# Patient Record
Sex: Male | Born: 1990 | Race: White | Hispanic: No | Marital: Single | Smoking: Never smoker
Health system: Southern US, Community
[De-identification: ages and names within clinical notes are randomized; demographics above are authoritative.]

## PROBLEM LIST (undated history)

## (undated) HISTORY — PX: TONSILLECTOMY: SUR1361

---
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
## 2013-03-20 ENCOUNTER — Emergency Department: Payer: Self-pay | Admitting: Emergency Medicine

## 2016-08-04 ENCOUNTER — Encounter: Payer: Self-pay | Admitting: Emergency Medicine

## 2016-08-04 ENCOUNTER — Ambulatory Visit
Admission: EM | Admit: 2016-08-04 | Discharge: 2016-08-04 | Disposition: A | Discharge status: Home / Self Care | Payer: BLUE CROSS/BLUE SHIELD | Attending: Family Medicine | Admitting: Family Medicine

## 2016-08-04 DIAGNOSIS — T1592XA Foreign body on external eye, part unspecified, left eye, initial encounter: Secondary | ICD-10-CM | POA: Diagnosis not present

## 2016-08-04 NOTE — ED Triage Notes (Signed)
Patient states that his left eye is red and irritated possibly by some metal.  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

## 2016-08-04 NOTE — ED Provider Notes (Signed)
CSN: [id number]     Arrival date & time 08/04/16  1339 History   First MD Initiated Contact with Patient 08/04/16 1433     Chief Complaint  Patient presents with  . Foreign Body in Eye   (Consider location/radiation/quality/duration/timing/severity/associated sxs/prior Treatment) Patient is a healthy 25 year old male, with no medical history, presents today for possible foreign body in his left eye. Patient started having symptoms yesterday complaining of eye pain with redness,  irritation and excessive tears. Patient denies itchiness. 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       History reviewed. No pertinent past medical history. History reviewed. No pertinent surgical history. History reviewed. No pertinent family history. Social History  Substance Use Topics  . Smoking status: Never Smoker  . Smokeless tobacco: Never Used  . Alcohol use Yes    Review of Systems  All other systems reviewed and are negative.   Allergies  Patient has no known allergies.  Home Medications   Prior to Admission medications   Not on File   Meds Ordered and Administered this Visit  Medications - No data to display  BP 132/72 (BP Location: Right Arm)   Pulse 88   Temp 98.7 F (37.1 C) (Oral)   Resp 16   Ht 5\' 11"  (1.803 m)   Wt 250 lb (113.4 kg)   SpO2 99%   BMI 34.87 kg/m  No data found.   Physical Exam  Constitutional: He appears well-developed and well-nourished.  HENT:  Head: Normocephalic and atraumatic.  Right eye unremarkable.  Left eye appears red. Has what appears to be a small round piece of metal in the left upper iris between 12-1 o'clock  Eyes: EOM are normal. Pupils are equal, round, and reactive to light. Right eye exhibits no discharge. Left eye exhibits no discharge. Foreign body present in the left eye. No scleral icterus.    Cardiovascular: Normal rate, regular rhythm and normal heart sounds.   Pulmonary/Chest: Effort  normal and breath sounds normal. No respiratory distress. He has no wheezes.  Nursing note and vitals reviewed.   Urgent Care Course   Clinical Course     Procedures (including critical care time)  Labs Review Labs Reviewed - No data to display  Imaging Review No results found.   Visual Acuity Review  Right Eye Distance: 20/20 uncorrected Left Eye Distance: 20/25 uncorrected Bilateral Distance: 20/20 uncorrected  MDM   1. Foreign body in eye, left, initial encounter    On examination, patient does have what appears to be a small round piece of metal in the left eye. Fluorescein eye exam shows no corneal abrasion. Supervising physician Dr. Thurmond ButtsWade attempted to remove with 18G needle and cotton-tip swab, but was unsuccessful.   [facility] Dr. Druscilla BrowniePorfilio consulted and will [facility] in 20 minutes. Patient discharged immediately and will meet Dr. Druscilla BrowniePorfilio at the [place] location.     Lucia EstelleFeng Loukas Antonson, NP 08/04/16 [phone]

## 2017-01-09 ENCOUNTER — Emergency Department
Admission: EM | Admit: 2017-01-09 | Discharge: 2017-01-09 | Disposition: A | Discharge status: Home / Self Care | Payer: BLUE CROSS/BLUE SHIELD | Attending: Emergency Medicine | Admitting: Emergency Medicine

## 2017-01-09 ENCOUNTER — Emergency Department: Payer: BLUE CROSS/BLUE SHIELD

## 2017-01-09 ENCOUNTER — Encounter: Payer: Self-pay | Admitting: Medical Oncology

## 2017-01-09 DIAGNOSIS — Y9389 Activity, other specified: Secondary | ICD-10-CM | POA: Diagnosis not present

## 2017-01-09 DIAGNOSIS — Y92410 Unspecified street and highway as the place of occurrence of the external cause: Secondary | ICD-10-CM | POA: Insufficient documentation

## 2017-01-09 DIAGNOSIS — S8011XA Contusion of right lower leg, initial encounter: Secondary | ICD-10-CM | POA: Diagnosis not present

## 2017-01-09 DIAGNOSIS — S5011XA Contusion of right forearm, initial encounter: Secondary | ICD-10-CM | POA: Diagnosis not present

## 2017-01-09 DIAGNOSIS — S59911A Unspecified injury of right forearm, initial encounter: Secondary | ICD-10-CM | POA: Diagnosis present

## 2017-01-09 DIAGNOSIS — Y998 Other external cause status: Secondary | ICD-10-CM | POA: Insufficient documentation

## 2017-01-09 MED ORDER — HYDROCODONE-ACETAMINOPHEN 5-325 MG PO TABS
1.0000 | ORAL_TABLET | Freq: Once | ORAL | Status: AC
  Administered 2017-01-09: 1 via ORAL
  Filled 2017-01-09: qty 1

## 2017-01-09 MED ORDER — IBUPROFEN 600 MG PO TABS: 600.0000 mg | ORAL_TABLET | Freq: Three times a day (TID) | ORAL | 0 refills | Status: DC | PRN

## 2017-01-09 MED ORDER — HYDROCODONE-ACETAMINOPHEN 5-325 MG PO TABS: 1.0000 | ORAL_TABLET | ORAL | 0 refills | Status: DC | PRN

## 2017-01-09 MED ORDER — IBUPROFEN 600 MG PO TABS
600.0000 mg | ORAL_TABLET | Freq: Once | ORAL | Status: AC
  Administered 2017-01-09: 600 mg via ORAL
  Filled 2017-01-09: qty 1

## 2017-01-09 NOTE — ED Notes (Signed)
Right forearm pain. No bruising, swelling. Pt unable to squeeze hand due to pain. +2 radial pulse. Right lateral calf pain. No bruising or swelling. Pain increases with flex.

## 2017-01-09 NOTE — ED Notes (Signed)
I called radiology about missing forearm xray read. They are looking into it.
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
## 2017-01-09 NOTE — Discharge Instructions (Signed)
Began taking medication as directed. Ibuprofen 600 mg 3 times a day with food. Norco one every 4-6 hours as needed for pain. Be aware that you cannot take Norco while driving as it contains narcotic. Moist heat or ice to your muscles as needed.  To [facility] if any continued problems.

## 2017-01-09 NOTE — ED Notes (Signed)
Pt discharged home after verbalizing understanding of discharge instructions; nad noted. 
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
## 2018-09-24 ENCOUNTER — Other Ambulatory Visit: Payer: Self-pay

## 2018-09-24 ENCOUNTER — Encounter: Payer: Self-pay | Admitting: Emergency Medicine

## 2018-09-24 ENCOUNTER — Ambulatory Visit
Admission: EM | Admit: 2018-09-24 | Discharge: 2018-09-24 | Disposition: A | Discharge status: Home / Self Care | Payer: BLUE CROSS/BLUE SHIELD | Attending: Family Medicine | Admitting: Family Medicine

## 2018-09-24 DIAGNOSIS — J111 Influenza due to unidentified influenza virus with other respiratory manifestations: Secondary | ICD-10-CM

## 2018-09-24 DIAGNOSIS — R509 Fever, unspecified: Secondary | ICD-10-CM

## 2018-09-24 DIAGNOSIS — R69 Illness, unspecified: Principal | ICD-10-CM

## 2018-09-24 DIAGNOSIS — R05 Cough: Secondary | ICD-10-CM

## 2018-09-24 LAB — RAPID INFLUENZA A&B ANTIGENS
Influenza A (ARMC): NEGATIVE
Influenza B (ARMC): NEGATIVE

## 2018-09-24 MED ORDER — OSELTAMIVIR PHOSPHATE 75 MG PO CAPS: 75.0000 mg | ORAL_CAPSULE | Freq: Two times a day (BID) | ORAL | 0 refills | Status: DC

## 2018-09-24 MED ORDER — IBUPROFEN 800 MG PO TABS: 800.0000 mg | ORAL_TABLET | Freq: Three times a day (TID) | ORAL | 0 refills | Status: DC | PRN

## 2018-09-24 MED ORDER — ACETAMINOPHEN 500 MG PO TABS: 500.0000 mg | ORAL_TABLET | Freq: Once | ORAL | Status: DC

## 2018-09-24 MED ORDER — ACETAMINOPHEN 500 MG PO TABS
1000.0000 mg | ORAL_TABLET | Freq: Once | ORAL | Status: AC
  Administered 2018-09-24: 1000 mg via ORAL

## 2018-09-24 NOTE — ED Provider Notes (Signed)
[facility]    CSN: [id number] Arrival date & time: 09/24/18  1640  History   Chief Complaint Chief Complaint  Patient presents with  . Cough  . Fever  . Generalized Body Aches   HPI  28 year old male presents with the above complaints.  Patient reports that his symptoms started on Monday.  He reports cough, fever, generalized body aches.  Has had a mild cough.  Fever has persisted.  Symptoms are severe.  He has been taking ibuprofen without resolution.  No known exacerbating factors.  No other associated symptoms.  No other complaints.  History reviewed and updated as below.  No significant PMH.  Home Medications    Prior to Admission medications   Medication Sig Start Date End Date Taking? Authorizing Provider  ibuprofen (ADVIL,MOTRIN) 800 MG tablet Take 1 tablet (800 mg total) by mouth every 8 (eight) hours as needed for fever, headache or moderate pain. 09/24/18   Tommie Sams, DO  oseltamivir (TAMIFLU) 75 MG capsule Take 1 capsule (75 mg total) by mouth every 12 (twelve) hours. 09/24/18   Tommie Sams, DO   Social History Social History   Tobacco Use  . Smoking status: Never Smoker  . Smokeless tobacco: Never Used  Substance Use Topics  . Alcohol use: Yes  . Drug use: Never    Allergies   Patient has no known allergies.   Review of Systems Review of Systems  Constitutional: Positive for chills and fever.  Respiratory: Positive for cough.   Musculoskeletal:       Body aches.   Physical Exam Triage Vital Signs ED Triage Vitals  Enc Vitals Group     BP 09/24/18 1656 126/90     Pulse Rate 09/24/18 1656 (!) 102     Resp 09/24/18 1656 18     Temp 09/24/18 1656 (!) 103 F (39.4 C)     Temp Source 09/24/18 1656 Oral     SpO2 09/24/18 1656 99 %     Weight 09/24/18 1654 250 lb (113.4 kg)     Height 09/24/18 1654 5\' 11"  (1.803 m)     Head Circumference --      Peak Flow --      Pain Score 09/24/18 1654 6     Pain Loc --      Pain Edu? --      Excl. in GC? --    Updated Vital Signs BP 126/90 (BP Location: Right Arm)   Pulse (!) 102   Temp (!) 103 F (39.4 C) (Oral)   Resp 18   Ht 5\' 11"  (1.803 m)   Wt 113.4 kg   SpO2 99%   BMI 34.87 kg/m   Visual Acuity Right Eye Distance:   Left Eye Distance:   Bilateral Distance:    Right Eye Near:   Left Eye Near:    Bilateral Near:     Physical Exam Vitals signs and nursing note reviewed.  Constitutional:      General: He is not in acute distress.    Appearance: Normal appearance.  HENT:     Head: Normocephalic and atraumatic.     Nose: Nose normal.     Mouth/Throat:     Pharynx: Oropharynx is clear. No posterior oropharyngeal erythema.  Eyes:     General:        Right eye: No discharge.        Left eye: No discharge.     Conjunctiva/sclera: Conjunctivae normal.  Cardiovascular:  Rate and Rhythm: Normal rate and regular rhythm.  Pulmonary:     Effort: Pulmonary effort is normal.     Breath sounds: Normal breath sounds. No wheezing, rhonchi or rales.  Neurological:     Mental Status: He is alert.  Psychiatric:        Mood and Affect: Mood normal.        Behavior: Behavior normal.    UC Treatments / Results  Labs (all labs ordered are listed, but only abnormal results are displayed) Labs Reviewed  RAPID INFLUENZA A&B ANTIGENS ([facility] ONLY)    EKG None  Radiology No results found.  Procedures Procedures (including critical care time)  Medications Ordered in UC Medications  acetaminophen (TYLENOL) tablet 1,000 mg (1,000 mg Oral Given 09/24/18 1706)    Initial Impression / Assessment and Plan / UC Course  I have reviewed the triage vital signs and the nursing notes.  Pertinent labs & imaging results that were available during my care of the patient were reviewed by me and considered in my medical decision making (see chart for details).    28 year old male presents with suspected influenza.  Treating with Tamiflu.  Ibuprofen and Tylenol as  directed.  Final Clinical Impressions(s) / UC Diagnoses   Final diagnoses:  Influenza-like illness     Discharge Instructions     Tylenol 1000 mg and Ibuprofen 800 mg every 8 hours for pain/fever.  Tamiflu as prescribed.  Take care  Dr. Adriana Simas    ED Prescriptions    Medication Sig Dispense Auth. Provider   ibuprofen (ADVIL,MOTRIN) 800 MG tablet Take 1 tablet (800 mg total) by mouth every 8 (eight) hours as needed for fever, headache or moderate pain. 30 tablet Javid Kemler G, DO   oseltamivir (TAMIFLU) 75 MG capsule Take 1 capsule (75 mg total) by mouth every 12 (twelve) hours. 10 capsule Tommie Sams, DO     Controlled Substance Prescriptions [place] Controlled Substance Registry consulted? Not Applicable   Tommie Sams, DO 09/24/18 7628

## 2018-09-24 NOTE — Discharge Instructions (Addendum)
Tylenol 1000 mg and Ibuprofen 800 mg every 8 hours for pain/fever.  Tamiflu as prescribed.  Take care  Dr. Adriana Simas

## 2018-09-24 NOTE — ED Triage Notes (Signed)
Patient cough, fever, generalized body aches that started 3 days ago. Patient has been taken Ibuprofen 600mg  for his fever.

## 2019-06-04 ENCOUNTER — Encounter: Payer: Self-pay | Admitting: Emergency Medicine

## 2019-06-04 ENCOUNTER — Other Ambulatory Visit: Payer: Self-pay

## 2019-06-04 ENCOUNTER — Ambulatory Visit
Admission: EM | Admit: 2019-06-04 | Discharge: 2019-06-04 | Disposition: A | Discharge status: Home / Self Care | Payer: Self-pay | Attending: Emergency Medicine | Admitting: Emergency Medicine

## 2019-06-04 DIAGNOSIS — L03211 Cellulitis of face: Secondary | ICD-10-CM

## 2019-06-04 DIAGNOSIS — R21 Rash and other nonspecific skin eruption: Secondary | ICD-10-CM

## 2019-06-04 MED ORDER — VALACYCLOVIR HCL 1 G PO TABS: 1000.0000 mg | ORAL_TABLET | Freq: Three times a day (TID) | ORAL | 0 refills | Status: AC

## 2019-06-04 MED ORDER — SULFAMETHOXAZOLE-TRIMETHOPRIM 800-160 MG PO TABS: 1.0000 | ORAL_TABLET | Freq: Two times a day (BID) | ORAL | 0 refills | Status: AC

## 2019-06-04 NOTE — Discharge Instructions (Signed)
Take medication as prescribed. Monitor as discussed.    Follow up with your primary care physician this week as needed. Return to Urgent care or ER for eye pain, ear changes, new or worsening concerns.

## 2019-06-04 NOTE — ED Provider Notes (Signed)
[facility] ____________________________________________  Time seen: Approximately 7:08 PM  I have reviewed the triage vital signs and the nursing notes.   HISTORY  Chief Complaint Facial Swelling   HPI Ethan Woods is a 28 y.o. male presenting for evaluation to rash to his forehead.  Reports rash has been present for the last week.  States initially look like 2 small pimples in which she tried to "pop "but did not get anything out of.  States he then noticed it increase in size and redness.  Reports some swelling just in front of his ear as well as underneath his jaw that is slightly tender.  Denies any rash to his ear or hearing changes or actual ear pain.  Denies any eye pain, vision changes or eye discomfort.  Denies insect bite, injury or trauma.  Denies others with similar.  Works on a Architect site.  Denies aggravating or alleviating factors.  Did have chickenpox as a child.  Reports otherwise doing well.   History reviewed. No pertinent past medical history.  There are no active problems to display for this patient.   Past Surgical History:  Procedure Laterality Date  . TONSILLECTOMY       No current facility-administered medications for this encounter.   Current Outpatient Medications:  .  sulfamethoxazole-trimethoprim (BACTRIM DS) 800-160 MG tablet, Take 1 tablet by mouth 2 (two) times daily for 10 days., Disp: 20 tablet, Rfl: 0 .  valACYclovir (VALTREX) 1000 MG tablet, Take 1 tablet (1,000 mg total) by mouth 3 (three) times daily for 7 days., Disp: 21 tablet, Rfl: 0  Allergies Patient has no known allergies.  History reviewed. No pertinent family history.  Social History Social History   Tobacco Use  . Smoking status: Never Smoker  . Smokeless tobacco: Never Used  Substance Use Topics  . Alcohol use: Yes  . Drug use: Never    Review of Systems Constitutional: No fever ENT: No sore throat. Cardiovascular: Denies chest pain.  Respiratory: Denies shortness of breath. Gastrointestinal: No abdominal pain.   Musculoskeletal: Negative for back pain. Skin: Positive for rash.   ____________________________________________   PHYSICAL EXAM:  VITAL SIGNS: ED Triage Vitals  Enc Vitals Group     BP 06/04/19 1817 137/88     Pulse Rate 06/04/19 1817 87     Resp 06/04/19 1817 18     Temp 06/04/19 1817 98.5 F (36.9 C)     Temp Source 06/04/19 1817 Oral     SpO2 06/04/19 1817 99 %     Weight 06/04/19 1815 250 lb (113.4 kg)     Height 06/04/19 1815 5\' 11"  (1.803 m)     Head Circumference --      Peak Flow --      Pain Score 06/04/19 1814 6     Pain Loc --      Pain Edu? --      Excl. in [place]? --     Constitutional: Alert and oriented. Well appearing and in no acute distress. Eyes: Conjunctivae are normal. PERRL. EOMI.No surrounding tenderness, swelling or erythema.  ENT      Head: Normocephalic      Ears: Left: Nontender, no rash, normal canal, no erythema, normal TM.      Nose: No congestion Hematological/Lymphatic/Immunilogical: Left mild preauricular lymphadenopathy and mild anterior cervical lymphadenopathy left-sided. Cardiovascular: Normal rate, regular rhythm. Grossly normal heart sounds. Good peripheral circulation. Respiratory: Normal respiratory effort without tachypnea nor retractions. Breath sounds are clear and equal bilaterally.  No wheezes, rales, rhonchi. Musculoskeletal: Steady gait. Neurologic:  Normal speech and language. Speech is normal. No gait instability.  Skin:  Skin is warm, dry.  Except: Left face rash as depicted below, two mildly erythematous clustered non vesicular rash, nontender, no drainage.    Psychiatric: Mood and affect are normal. Speech and behavior are normal. Patient exhibits appropriate insight and judgment   ___________________________________________   LABS (all labs ordered are listed, but only abnormal results are displayed)  Labs Reviewed - No data to  display   PROCEDURES Procedures     INITIAL IMPRESSION / ASSESSMENT AND PLAN / ED COURSE  Pertinent labs & imaging results that were available during my care of the patient were reviewed by me and considered in my medical decision making (see chart for details).  Well appearing patient. Left facial rash. Concern for cellulitis vs shingles. Will treat with bactrim and valtrex. Keep clean, supportive care and monitor. Strict reevaluation for eye pain, ear pain, ear rash or worsening concerns. Discussed indication, risks and benefits of medications with patient.  Discussed follow up with Primary care physician this week. Discussed follow up and return parameters including no resolution or any worsening concerns. Patient verbalized understanding and agreed to plan.   ____________________________________________   FINAL CLINICAL IMPRESSION(S) / ED DIAGNOSES  Final diagnoses:  Facial cellulitis     ED Discharge Orders         Ordered    sulfamethoxazole-trimethoprim (BACTRIM DS) 800-160 MG tablet  2 times daily     06/04/19 1835    valACYclovir (VALTREX) 1000 MG tablet  3 times daily     06/04/19 1835           Note: This dictation was prepared with Dragon dictation along with smaller phrase technology. Any transcriptional errors that result from this process are unintentional.         Renford Dills, NP 06/04/19 1923

## 2019-06-04 NOTE — ED Triage Notes (Signed)
Pt c/o swelling on the left side of his face in front of his ear. He states that he feels a "knot" in the area. His left hurts and he has a rash on his forehead. Started about 4 days ago.

## 2020-05-10 ENCOUNTER — Other Ambulatory Visit: Payer: Self-pay

## 2020-05-20 ENCOUNTER — Encounter: Payer: Self-pay | Admitting: Emergency Medicine

## 2020-05-20 ENCOUNTER — Other Ambulatory Visit: Payer: Self-pay

## 2020-05-20 ENCOUNTER — Ambulatory Visit
Admission: EM | Admit: 2020-05-20 | Discharge: 2020-05-20 | Disposition: A | Discharge status: Home / Self Care | Payer: Self-pay | Attending: Physician Assistant | Admitting: Physician Assistant

## 2020-05-20 DIAGNOSIS — R059 Cough, unspecified: Secondary | ICD-10-CM | POA: Insufficient documentation

## 2020-05-20 DIAGNOSIS — J209 Acute bronchitis, unspecified: Secondary | ICD-10-CM | POA: Insufficient documentation

## 2020-05-20 DIAGNOSIS — J029 Acute pharyngitis, unspecified: Secondary | ICD-10-CM | POA: Insufficient documentation

## 2020-05-20 LAB — GROUP A STREP BY PCR: Group A Strep by PCR: NOT DETECTED

## 2020-05-20 MED ORDER — GUAIFENESIN-CODEINE 100-10 MG/5ML PO SYRP: 5.0000 mL | ORAL_SOLUTION | Freq: Four times a day (QID) | ORAL | 0 refills | Status: DC | PRN

## 2020-05-20 MED ORDER — LIDOCAINE VISCOUS HCL 2 % MT SOLN: 15.0000 mL | OROMUCOSAL | 0 refills | Status: DC | PRN

## 2020-05-20 MED ORDER — AZITHROMYCIN 250 MG PO TABS: 250.0000 mg | ORAL_TABLET | Freq: Every day | ORAL | 0 refills | Status: DC

## 2020-05-20 MED ORDER — PREDNISONE 20 MG PO TABS: 40.0000 mg | ORAL_TABLET | Freq: Every day | ORAL | 0 refills | Status: AC

## 2020-05-20 MED ORDER — PREDNISONE 20 MG PO TABS: 40.0000 mg | ORAL_TABLET | Freq: Every day | ORAL | 0 refills | Status: DC

## 2020-05-20 MED ORDER — LIDOCAINE VISCOUS HCL 2 % MT SOLN: 15.0000 mL | OROMUCOSAL | 0 refills | Status: AC | PRN

## 2020-05-20 MED ORDER — GUAIFENESIN-CODEINE 100-10 MG/5ML PO SYRP: 5.0000 mL | ORAL_SOLUTION | Freq: Four times a day (QID) | ORAL | 0 refills | Status: AC | PRN

## 2020-05-20 NOTE — ED Triage Notes (Signed)
Patient c/o sore throat and cough that started 2 weeks ago.  Patient denies fevers.

## 2020-05-20 NOTE — ED Provider Notes (Signed)
[facility]    CSN: [id number] Arrival date & time: 05/20/20  0263      History   Chief Complaint Chief Complaint  Patient presents with  . Sore Throat  . Cough    HPI Ethan Woods is a 29 y.o. male presenting for 2 week history of sore throat and cough.  Patient says he is most concerned about his throat.  He says he has been coughing so much that his throat hurts.  Patient denies any associated fever, fatigue, body aches, sinus pain, ear pain, nasal congestion, chest pain or breathing difficulty.  He states that he feels like he is wheezing at times due to the "stuff in his chest."  He says that he is coughing up green mucus.  Has been taking over-the-counter Mucinex without relief.  States that he feels aches getting worse.  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  Denies any known Covid exposure.  Patient not Covid vaccinated.  He is otherwise healthy without any medical problems.  Patient has no other complaints or concerns today.  HPI  History reviewed. No pertinent past medical history.  There are no problems to display for this patient.   Past Surgical History:  Procedure Laterality Date  . TONSILLECTOMY         Home Medications    Prior to Admission medications   Medication Sig Start Date End Date Taking? Authorizing Provider  azithromycin (ZITHROMAX) 250 MG tablet Take 1 tablet (250 mg total) by mouth daily. Take first 2 tablets together, then 1 every day until finished. 05/20/20   Shirlee Latch, PA-C  guaiFENesin-codeine (ROBITUSSIN AC) 100-10 MG/5ML syrup Take 5 mLs by mouth 4 (four) times daily as needed for up to 7 days for cough. 05/20/20 05/27/20  Eusebio Friendly B, PA-C  lidocaine (XYLOCAINE) 2 % solution Use as directed 15 mLs in the mouth or throat every 3 (three) hours as needed for up to 5 days for mouth pain. 05/20/20 05/25/20  Shirlee Latch, PA-C  predniSONE (DELTASONE) 20 MG tablet  Take 2 tablets (40 mg total) by mouth daily for 5 days. 05/20/20 05/25/20  Shirlee Latch, PA-C    Family History History reviewed. No pertinent family history.  Social History Social History   Tobacco Use  . Smoking status: Never Smoker  . Smokeless tobacco: Never Used  Vaping Use  . Vaping Use: Never used  Substance Use Topics  . Alcohol use: Yes  . Drug use: Never     Allergies   Patient has no known allergies.   Review of Systems Review of Systems  Constitutional: Negative for fatigue and fever.  HENT: Positive for congestion and sore throat. Negative for rhinorrhea, sinus pressure and sinus pain.   Respiratory: Positive for cough. Negative for shortness of breath.   Gastrointestinal: Negative for abdominal pain, diarrhea, nausea and vomiting.  Musculoskeletal: Negative for myalgias.  Neurological: Negative for weakness, light-headedness and headaches.  Hematological: Negative for adenopathy.     Physical Exam Triage Vital Signs ED Triage Vitals  Enc Vitals Group     BP 05/20/20 0945 139/87     Pulse Rate 05/20/20 0945 61     Resp 05/20/20 0945 16     Temp 05/20/20 0945 98.1 F (36.7 C)     Temp Source 05/20/20 0945 Oral     SpO2 05/20/20 0945 100 %     Weight 05/20/20 0943 255  lb (115.7 kg)     Height 05/20/20 0943 6' (1.829 m)     Head Circumference --      Peak Flow --      Pain Score 05/20/20 0943 8     Pain Loc --      Pain Edu? --      Excl. in GC? --    No data found.  Updated Vital Signs BP 139/87 (BP Location: Left Arm)   Pulse 61   Temp 98.1 F (36.7 C) (Oral)   Resp 16   Ht 6' (1.829 m)   Wt 255 lb (115.7 kg)   SpO2 100%   BMI 34.58 kg/m       Physical Exam Vitals and nursing note reviewed.  Constitutional:      General: He is not in acute distress.    Appearance: Normal appearance. He is well-developed. He is not ill-appearing or diaphoretic.  HENT:     Head: Normocephalic and atraumatic.     Nose: Nose normal.      Mouth/Throat:     Pharynx: Uvula midline. Posterior oropharyngeal erythema present. No oropharyngeal exudate.     Tonsils: No tonsillar abscesses.     Comments: Moderate posterior pharyngeal erythema.  Mild swelling of posterior pharynx.  Absent tonsils. Eyes:     General: No scleral icterus.       Right eye: No discharge.        Left eye: No discharge.     Conjunctiva/sclera: Conjunctivae normal.     Pupils: Pupils are equal, round, and reactive to light.  Neck:     Thyroid: No thyromegaly.     Trachea: No tracheal deviation.  Cardiovascular:     Rate and Rhythm: Normal rate and regular rhythm.     Heart sounds: Normal heart sounds.  Pulmonary:     Effort: Pulmonary effort is normal. No respiratory distress.     Breath sounds: Normal breath sounds. No wheezing or rales.  Musculoskeletal:     Cervical back: Normal range of motion and neck supple.  Lymphadenopathy:     Cervical: No cervical adenopathy.  Skin:    General: Skin is warm and dry.     Findings: No rash.  Neurological:     General: No focal deficit present.     Mental Status: He is alert. Mental status is at baseline.     Motor: No weakness.     Gait: Gait normal.  Psychiatric:        Mood and Affect: Mood normal.        Behavior: Behavior normal.        Thought Content: Thought content normal.      UC Treatments / Results  Labs (all labs ordered are listed, but only abnormal results are displayed) Labs Reviewed  GROUP A STREP BY PCR    EKG   Radiology No results found.  Procedures Procedures (including critical care time)  Medications Ordered in UC Medications - No data to display  Initial Impression / Assessment and Plan / UC Course  I have reviewed the triage vital signs and the nursing notes.  Pertinent labs & imaging results that were available during my care of the patient were reviewed by me and considered in my medical decision making (see chart for details).   29 year old male  presenting for productive cough and sore throat for the past 2 weeks.  States he is worsening.  Strep test obtained today with negative result.  Patient says he  is most concerned about the throat pain.  Patient says he would like some "good syrup" to help coat his throat.  Declined Covid testing.  Treating at this time for suspected bronchitis with azithromycin.  Advised this will also cover atypical bacterial infection of the throat.  Prednisone for swelling of the throat and bronchitis.  Viscous lidocaine for the throat pain and Cheratussin as needed and as prescribed for cough.  Advise follow-up for any new or worsening symptoms or if he is not better over the next few days.  Advised at this could likely be a viral infection and he may still have symptoms for the next couple weeks.  Follow-up for any fevers or breathing difficulty.  Patient agreeable.  Final Clinical Impressions(s) / UC Diagnoses   Final diagnoses:  Acute bronchitis, unspecified organism  Sore throat  Cough   Discharge Instructions   None    ED Prescriptions    Medication Sig Dispense Auth. Provider   azithromycin (ZITHROMAX) 250 MG tablet  (Status: Discontinued) Take 1 tablet (250 mg total) by mouth daily. Take first 2 tablets together, then 1 every day until finished. 6 tablet Eusebio Friendly B, PA-C   lidocaine (XYLOCAINE) 2 % solution  (Status: Discontinued) Use as directed 15 mLs in the mouth or throat every 3 (three) hours as needed for up to 5 days for mouth pain. 120 mL Eusebio Friendly B, PA-C   predniSONE (DELTASONE) 20 MG tablet  (Status: Discontinued) Take 2 tablets (40 mg total) by mouth daily for 5 days. 10 tablet Eusebio Friendly B, PA-C   guaiFENesin-codeine (ROBITUSSIN AC) 100-10 MG/5ML syrup  (Status: Discontinued) Take 5 mLs by mouth 4 (four) times daily as needed for up to 7 days for cough. 120 mL Eusebio Friendly B, PA-C   azithromycin (ZITHROMAX) 250 MG tablet  (Status: Discontinued) Take 1 tablet (250 mg total) by  mouth daily. Take first 2 tablets together, then 1 every day until finished. 6 tablet Shirlee Latch, PA-C   guaiFENesin-codeine (ROBITUSSIN AC) 100-10 MG/5ML syrup  (Status: Discontinued) Take 5 mLs by mouth 4 (four) times daily as needed for up to 7 days for cough. 120 mL Eusebio Friendly B, PA-C   lidocaine (XYLOCAINE) 2 % solution Use as directed 15 mLs in the mouth or throat every 3 (three) hours as needed for up to 5 days for mouth pain. 120 mL Eusebio Friendly B, PA-C   predniSONE (DELTASONE) 20 MG tablet  (Status: Discontinued) Take 2 tablets (40 mg total) by mouth daily for 5 days. 10 tablet Eusebio Friendly B, PA-C   azithromycin (ZITHROMAX) 250 MG tablet Take 1 tablet (250 mg total) by mouth daily. Take first 2 tablets together, then 1 every day until finished. 6 tablet Shirlee Latch, PA-C   guaiFENesin-codeine (ROBITUSSIN AC) 100-10 MG/5ML syrup Take 5 mLs by mouth 4 (four) times daily as needed for up to 7 days for cough. 120 mL Eusebio Friendly B, PA-C   predniSONE (DELTASONE) 20 MG tablet Take 2 tablets (40 mg total) by mouth daily for 5 days. 10 tablet Gareth Morgan     PDMP not reviewed this encounter.   Shirlee Latch, PA-C 05/20/20 1016

## 2020-11-30 ENCOUNTER — Other Ambulatory Visit: Payer: Self-pay

## 2020-11-30 ENCOUNTER — Ambulatory Visit
Admission: EM | Admit: 2020-11-30 | Discharge: 2020-11-30 | Disposition: A | Discharge status: Home / Self Care | Payer: Self-pay | Attending: Sports Medicine | Admitting: Sports Medicine

## 2020-11-30 ENCOUNTER — Ambulatory Visit (INDEPENDENT_AMBULATORY_CARE_PROVIDER_SITE_OTHER): Payer: Self-pay

## 2020-11-30 DIAGNOSIS — S93402A Sprain of unspecified ligament of left ankle, initial encounter: Secondary | ICD-10-CM

## 2020-11-30 DIAGNOSIS — W1842XA Slipping, tripping and stumbling without falling due to stepping into hole or opening, initial encounter: Secondary | ICD-10-CM

## 2020-11-30 DIAGNOSIS — M25472 Effusion, left ankle: Secondary | ICD-10-CM

## 2020-11-30 DIAGNOSIS — M25572 Pain in left ankle and joints of left foot: Secondary | ICD-10-CM

## 2020-11-30 MED ORDER — TRAMADOL HCL 50 MG PO TABS: 100.0000 mg | ORAL_TABLET | Freq: Four times a day (QID) | ORAL | 0 refills | Status: DC | PRN

## 2020-11-30 NOTE — Discharge Instructions (Addendum)
Your x-rays today did not reveal the presence of any broken bones or dislocations.  Your exam is consistent with a significant sprain.  Wear the ankle brace to give you some support and use the crutches to assist with weightbearing.  You may gradually increase weightbearing as tolerated.  Keep your left ankle elevated as much as possible to help decrease swelling and aid in healing.  Use over-the-counter Tylenol and ibuprofen according to the package instructions as needed for mild to moderate pain and use the tramadol as needed for severe pain.  This will make you drowsy so do not drink alcohol or drive if you take it.  Follow the rehabilitation exercises given to you at discharge.  If your symptoms do not improve in the next 2 weeks follow-up with orthopedics.

## 2020-11-30 NOTE — ED Triage Notes (Signed)
Patient states that he stepped in a hole on Sunday. States that pain has started to worsen and he cannot bear weight.

## 2020-11-30 NOTE — ED Provider Notes (Signed)
[facility]    CSN: [id number] Arrival date & time: 11/30/20  1800      History   Chief Complaint Chief Complaint  Patient presents with  . Ankle Pain    left    HPI Ethan Woods is a 30 y.o. male.   HPI   30 year old male here for evaluation of left ankle pain.  Patient reports that he stepped in a hole and inverted his foot 3 days ago.  Since then he has had a continual worsening of his pain and now it hurts for him to bear weight.  Patient states that he is also having numbness and tingling in his toes.  The ankle and foot also exhibits some swelling.  History reviewed. No pertinent past medical history.  There are no problems to display for this patient.   Past Surgical History:  Procedure Laterality Date  . TONSILLECTOMY         Home Medications    Prior to Admission medications   Medication Sig Start Date End Date Taking? Authorizing Provider  traMADol (ULTRAM) 50 MG tablet Take 2 tablets (100 mg total) by mouth every 6 (six) hours as needed. 11/30/20  Yes Becky Augusta, NP    Family History History reviewed. No pertinent family history.  Social History Social History   Tobacco Use  . Smoking status: Never Smoker  . Smokeless tobacco: Never Used  Vaping Use  . Vaping Use: Never used  Substance Use Topics  . Alcohol use: Yes  . Drug use: Never     Allergies   Patient has no known allergies.   Review of Systems Review of Systems  Constitutional: Negative for activity change, appetite change and fatigue.  Musculoskeletal: Positive for arthralgias and joint swelling. Negative for myalgias.  Skin: Negative for color change and rash.  Neurological: Positive for numbness. Negative for weakness.  Hematological: Negative.   Psychiatric/Behavioral: Negative.      Physical Exam Triage Vital Signs ED Triage Vitals  Enc Vitals Group     BP 11/30/20 1854 139/89     Pulse Rate 11/30/20 1854 85     Resp 11/30/20 1854 18     Temp  11/30/20 1854 98.7 F (37.1 C)     Temp Source 11/30/20 1854 Oral     SpO2 11/30/20 1854 100 %     Weight 11/30/20 1852 250 lb (113.4 kg)     Height 11/30/20 1852 5\' 10"  (1.778 m)     Head Circumference --      Peak Flow --      Pain Score 11/30/20 1852 9     Pain Loc --      Pain Edu? --      Excl. in GC? --    No data found.  Updated Vital Signs BP 139/89 (BP Location: Left Arm)   Pulse 85   Temp 98.7 F (37.1 C) (Oral)   Resp 18   Ht 5\' 10"  (1.778 m)   Wt 250 lb (113.4 kg)   SpO2 100%   BMI 35.87 kg/m   Visual Acuity Right Eye Distance:   Left Eye Distance:   Bilateral Distance:    Right Eye Near:   Left Eye Near:    Bilateral Near:     Physical Exam Vitals and nursing note reviewed.  Constitutional:      General: He is not in acute distress.    Appearance: Normal appearance. He is obese. He is not ill-appearing.  Musculoskeletal:  General: Swelling and tenderness present. No deformity.  Skin:    General: Skin is warm and dry.     Capillary Refill: Capillary refill takes less than 2 seconds.     Findings: No bruising or erythema.  Neurological:     General: No focal deficit present.     Mental Status: He is alert and oriented to person, place, and time.     Sensory: No sensory deficit.     Motor: No weakness.  Psychiatric:        Mood and Affect: Mood normal.        Behavior: Behavior normal.        Thought Content: Thought content normal.        Judgment: Judgment normal.      UC Treatments / Results  Labs (all labs ordered are listed, but only abnormal results are displayed) Labs Reviewed - No data to display  EKG   Radiology DG Ankle Complete Left  Result Date: 11/30/2020 CLINICAL DATA:  Left ankle pain. Swelling. Stepped in a hole on Sunday, progressive pain. EXAM: LEFT ANKLE COMPLETE - 3+ VIEW COMPARISON:  None. FINDINGS: There is no evidence of fracture, dislocation, or joint effusion. Ankle mortise is preserved. Base of the fifth  metatarsal is intact. There is no evidence of arthropathy or other focal bone abnormality. Mild lateral soft tissue edema. IMPRESSION: Mild lateral soft tissue edema. No acute osseous abnormality. Electronically Signed   By: Narda Rutherford M.D.   On: 11/30/2020 19:31    Procedures Procedures (including critical care time)  Medications Ordered in UC Medications - No data to display  Initial Impression / Assessment and Plan / UC Course  I have reviewed the triage vital signs and the nursing notes.  Pertinent labs & imaging results that were available during my care of the patient were reviewed by me and considered in my medical decision making (see chart for details).   Patient is a very pleasant 29 year old male here for evaluation of left ankle pain that has been going on for last 3 days.  Patient that he is unable to bear weight due to the pain.  Patient complaining of pain on the inside of his left ankle, the proximal lateral aspect of the midfoot, and the posterior aspect below the medial malleolus overlying the calcaneal talofibular ligament.  There is no ecchymosis or erythema noted.  DP and PT pulses are 2+.  Patient does have marked tenderness to palpation of the medial malleolus but no tenderness when palpating over the swollen area of the proximal lateral midfoot.  Patient has limited range of motion but full sensation of his toes.  Radiographs obtained in triage.  Left ankle films independently reviewed and evaluated by me.  Interpretation: There is a lucency visible in the AP and oblique views inferior to the medial malleolus that is well-rounded.  There is also significant soft tissue swelling to the medial aspect of the left ankle.  There are no other fractures or dislocations appreciated on x-ray.  Awaiting radiology overread.  Interpretation of x-rays is consistent with my interpretation.  Will discharge patient home with a diagnosis of ankle sprain with ASO ankle brace and  crutches.  We will have patient use over-the-counter Tylenol and ibuprofen as needed for mild to moderate pain and will give some tramadol to cover for severe pain.  Patient will be given home PT and rehab exercises.   Final Clinical Impressions(s) / UC Diagnoses   Final diagnoses:  Sprain of  left ankle, unspecified ligament, initial encounter     Discharge Instructions     Your x-rays today did not reveal the presence of any broken bones or dislocations.  Your exam is consistent with a significant sprain.  Wear the ankle brace to give you some support and use the crutches to assist with weightbearing.  You may gradually increase weightbearing as tolerated.  Keep your left ankle elevated as much as possible to help decrease swelling and aid in healing.  Use over-the-counter Tylenol and ibuprofen according to the package instructions as needed for mild to moderate pain and use the tramadol as needed for severe pain.  This will make you drowsy so do not drink alcohol or drive if you take it.  Follow the rehabilitation exercises given to you at discharge.  If your symptoms do not improve in the next 2 weeks follow-up with orthopedics.    ED Prescriptions    Medication Sig Dispense Auth. Provider   traMADol (ULTRAM) 50 MG tablet Take 2 tablets (100 mg total) by mouth every 6 (six) hours as needed. 15 tablet Becky Augusta, NP     I have reviewed the PDMP during this encounter.   Becky Augusta, NP 11/30/20 1944

## 2021-12-06 ENCOUNTER — Ambulatory Visit
Admission: EM | Admit: 2021-12-06 | Discharge: 2021-12-06 | Disposition: A | Discharge status: Home / Self Care | Payer: Self-pay | Attending: Emergency Medicine | Admitting: Emergency Medicine

## 2021-12-06 ENCOUNTER — Ambulatory Visit (INDEPENDENT_AMBULATORY_CARE_PROVIDER_SITE_OTHER): Payer: Self-pay

## 2021-12-06 DIAGNOSIS — S61111A Laceration without foreign body of right thumb with damage to nail, initial encounter: Secondary | ICD-10-CM

## 2021-12-06 MED ORDER — IBUPROFEN 600 MG PO TABS: 600.0000 mg | ORAL_TABLET | Freq: Four times a day (QID) | ORAL | 0 refills | Status: DC | PRN

## 2021-12-06 MED ORDER — CEPHALEXIN 500 MG PO CAPS: 1000.0000 mg | ORAL_CAPSULE | Freq: Two times a day (BID) | ORAL | 0 refills | Status: AC

## 2021-12-06 NOTE — Discharge Instructions (Addendum)
Take 600 mg of ibuprofen with 1000 mg of Tylenol 3-4 times a day as needed for pain.  Finish the Keflex.  Keep clean and dry for 72 hours.  Work note for tomorrow, light duty until we see you in 10 days for suture removal.  Return sooner for any signs of infection. ?

## 2021-12-06 NOTE — ED Triage Notes (Signed)
Patient is here for "right thumb laceration". DOI: [id number]. Time: "0900-10 am". "Cleaning grinder, kicked back on me, split me open". "Got fingernail too". ? Tdap utd.  ?

## 2021-12-06 NOTE — ED Provider Notes (Signed)
HPI ? ?SUBJECTIVE: ? ?Ethan Woods is a right-handed 31 y.o. male who presents with a laceration to his distal right thumb.  States was working with a Designer, television/film set, when it kicked back, injuring his thumb.  He reports numbness, tingling, constant throbbing pain.  No foreign body sensation, limitation of motion.    He cleaned it out with wound cleaner, washed it with soap and water in the sink twice and applied pressure with hemostasis.  No aggravating factors.  He has no past medical history.His tetanus is up-to-date.  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  ? ? ? ?History reviewed. No pertinent past medical history. ? ?Past Surgical History:  ?Procedure Laterality Date  ? TONSILLECTOMY    ? ? ?No family history on file. ? ?Social History  ? ?Tobacco Use  ? Smoking status: Never  ? Smokeless tobacco: Never  ?Vaping Use  ? Vaping Use: Never used  ?Substance Use Topics  ? Alcohol use: Yes  ?  Comment: Occ.  ? Drug use: Never  ? ? ?No current facility-administered medications for this encounter. ? ?Current Outpatient Medications:  ?  cephALEXin (KEFLEX) 500 MG capsule, Take 2 capsules (1,000 mg total) by mouth 2 (two) times daily for 5 days., Disp: 20 capsule, Rfl: 0 ?  ibuprofen (ADVIL) 600 MG tablet, Take 1 tablet (600 mg total) by mouth every 6 (six) hours as needed., Disp: 30 tablet, Rfl: 0 ? ?No Known Allergies ? ? ?ROS ? ?As noted in HPI.  ? ?Physical Exam ? ?BP 134/84 (BP Location: Left Arm)   Pulse 89   Temp 98.1 ?F (36.7 ?C) (Oral)   Resp 20   Ht 5\' 11"  (1.803 m)   Wt 113.4 kg   SpO2 98%   BMI 34.87 kg/m?  ? ?Constitutional: Well developed, well nourished, no acute distress ?Eyes:  EOMI, conjunctiva normal bilaterally ?HENT: Normocephalic, atraumatic,mucus membranes moist ?Respiratory: Normal inspiratory effort ?Cardiovascular: Normal rate ?GI: nondistended ?skin: No rash, skin intact ?Musculoskeletal:  ?3 cm irregular laceration distal right thumb with nail damage.  Two-point discrimination intact.   Patient able to flex/extend at the IP joint. ? ? ? ? ? ? ? ? ?Neurologic: Alert & oriented x 3, no focal neuro deficits ?Psychiatric: Speech and behavior appropriate ? ? ?ED Course ? ? ?Medications - No data to display ? ?Orders Placed This Encounter  ?Procedures  ? DG Finger Thumb Right  ?  Standing Status:   Standing  ?  Number of Occurrences:   1  ?  Order Specific Question:   Reason for Exam (SYMPTOM  OR DIAGNOSIS REQUIRED)  ?  Answer:   Laceration distal thumb rule out fracture, foreign body  ? Wound care  ?  Pre and Post Procedure.  ?  Standing Status:   Standing  ?  Number of Occurrences:   1  ? ? ?No results found for this or any previous visit (from the past 24 hour(s)). ?DG Finger Thumb Right ? ?Result Date: 12/06/2021 ?CLINICAL DATA:  Laceration thumb EXAM: RIGHT THUMB 2+V COMPARISON:  None Available. FINDINGS: Negative for fracture or foreign body. Soft tissue swelling and laceration distal thumb. IMPRESSION: Negative for fracture.  Soft tissue injury Electronically Signed   By: Franchot Gallo M.D.   On: 12/06/2021 19:14   ? ?ED Clinical Impression ? ?1. Laceration of right thumb without foreign body with damage to nail, initial encounter   ?  ? ?ED Assessment/Plan ? ?Reviewed imaging independently.  No fracture.  Positive soft tissue swelling.  See radiology report for full details ? ?Procedure note: Performed a digital block with 2 cc plain lidocaine 1% with adequate anesthesia.  Irrigated and scrubbed the wound out extensively with chlorhexidine and tap water.  Wound explored with adequate hemostasis.  No apparent foreign body seen.  Again cleaned with chlorhexidine/tap water.  Placed 3 5-0 interrupted Ethilon sutures with loose approximation of wound edges at the distal tip of the thumb.  Placed Steri-Strips over the remainder of the wound.  Dressing placed.  Patient tolerated procedure well. ? ?Patient states tetanus is up-to-date. ? ?Home with Tylenol/ibuprofen, Keflex, keep clean and dry for 72  hours, light duty until we see him in 10 days for suture removal.  Work note for tomorrow.  Return sooner for any signs of infection. ? ?Discussed imaging, MDM, treatment plan, and plan for follow-up with patient. patient agrees with plan.  ? ?Meds ordered this encounter  ?Medications  ? cephALEXin (KEFLEX) 500 MG capsule  ?  Sig: Take 2 capsules (1,000 mg total) by mouth 2 (two) times daily for 5 days.  ?  Dispense:  20 capsule  ?  Refill:  0  ? ibuprofen (ADVIL) 600 MG tablet  ?  Sig: Take 1 tablet (600 mg total) by mouth every 6 (six) hours as needed.  ?  Dispense:  30 tablet  ?  Refill:  0  ? ? ? ? ?*This clinic note was created using [employer]. Therefore, there may be occasional mistakes despite careful proofreading. ? ?? ? ?  ?Melynda Ripple, MD ?12/07/21 [phone] ? ?

## 2021-12-15 ENCOUNTER — Other Ambulatory Visit: Payer: Self-pay

## 2021-12-15 ENCOUNTER — Ambulatory Visit
Admission: EM | Admit: 2021-12-15 | Discharge: 2021-12-15 | Disposition: A | Discharge status: Home / Self Care | Payer: Self-pay

## 2021-12-15 DIAGNOSIS — S61111D Laceration without foreign body of right thumb with damage to nail, subsequent encounter: Secondary | ICD-10-CM

## 2021-12-15 NOTE — ED Triage Notes (Signed)
Pt had 3 sutures placed on the right thumb. Pt states that they have healed nicely.  ? ?3 sutures have been removed from the patients right thumb. Pt voiced no concerns or discomfort at time of removal.  ?

## 2022-08-14 ENCOUNTER — Ambulatory Visit
Admission: RE | Admit: 2022-08-14 | Discharge: 2022-08-14 | Disposition: A | Discharge status: Home / Self Care | Payer: Self-pay | Source: Ambulatory Visit | Attending: Emergency Medicine | Admitting: Emergency Medicine

## 2022-08-14 VITALS — BP 121/81 | HR 72 | Temp 98.0°F | Resp 16

## 2022-08-14 DIAGNOSIS — S39011A Strain of muscle, fascia and tendon of abdomen, initial encounter: Secondary | ICD-10-CM

## 2022-08-14 MED ORDER — IBUPROFEN 600 MG PO TABS: 600.0000 mg | ORAL_TABLET | Freq: Four times a day (QID) | ORAL | 0 refills | Status: AC | PRN

## 2022-08-14 MED ORDER — BACLOFEN 10 MG PO TABS: 10.0000 mg | ORAL_TABLET | Freq: Three times a day (TID) | ORAL | 0 refills | Status: AC

## 2022-08-14 NOTE — ED Triage Notes (Signed)
Pt presents with a knot on the right side of his abdomen x 2 weeks. Pt suspects its a hernia. He describes the pain as dull.

## 2022-08-14 NOTE — Discharge Instructions (Signed)
Take the ibuprofen, 600 mg every 6 hours with food, on a schedule for the next 48 hours and then as needed.  Take the baclofen, 10 mg every 8 hours, on a schedule for the next 48 hours and then as needed.  Apply moist heat to your abdomen for 30 minutes at a time 2-3 times a day to improve blood flow to the area and help remove the lactic acid causing the spasm.  Protect that area from further injury and avoid heavy lifting, straining, or sudden sharp movements.  You can purchase an abdominal binder from [employer] to help support the area until your pain improves.  Return for reevaluation for any new or worsening symptoms.

## 2022-08-14 NOTE — ED Provider Notes (Signed)
[facility]    CSN: [id number] Arrival date & time: 08/14/22  0856      History   Chief Complaint Chief Complaint  Patient presents with   Abdominal Pain    Appt. 9am    HPI Ethan Woods is a 32 y.o. male.   HPI  32 year old male here for evaluation of abdominal pain.  The patient reports that he has been experiencing a knot on the right side of his abdomen that is hard and associated with abdominal pain.  He reports that 2 weeks ago he did a sharp twisting motion while pushing a piece of pipe at work and felt a sharp pain and burning in that area.  That eventually resolved but approximately week ago he noticed a dull intermittent pain and a hard spot.  He is concerned he may have a hernia.  He denies any fever, nausea vomiting, change in appetite, or changes in the size of the area in question.  History reviewed. No pertinent past medical history.  There are no problems to display for this patient.   Past Surgical History:  Procedure Laterality Date   TONSILLECTOMY         Home Medications    Prior to Admission medications   Medication Sig Start Date End Date Taking? Authorizing Provider  baclofen (LIORESAL) 10 MG tablet Take 1 tablet (10 mg total) by mouth 3 (three) times daily. 08/14/22  Yes Margarette Canada, NP  ibuprofen (ADVIL) 600 MG tablet Take 1 tablet (600 mg total) by mouth every 6 (six) hours as needed. 08/14/22   Margarette Canada, NP    Family History History reviewed. No pertinent family history.  Social History Social History   Tobacco Use   Smoking status: Never   Smokeless tobacco: Never  Vaping Use   Vaping Use: Never used  Substance Use Topics   Alcohol use: Yes    Comment: Occ.   Drug use: Never     Allergies   Patient has no known allergies.   Review of Systems Review of Systems  Constitutional:  Negative for fever.  Gastrointestinal:  Positive for abdominal pain. Negative for nausea and vomiting.  Musculoskeletal:   Positive for myalgias.     Physical Exam Triage Vital Signs ED Triage Vitals  Enc Vitals Group     BP 08/14/22 0916 121/81     Pulse Rate 08/14/22 0916 72     Resp 08/14/22 0916 16     Temp 08/14/22 0916 98 F (36.7 C)     Temp Source 08/14/22 0916 Oral     SpO2 08/14/22 0916 97 %     Weight --      Height --      Head Circumference --      Peak Flow --      Pain Score 08/14/22 0915 3     Pain Loc --      Pain Edu? --      Excl. in [place]? --    No data found.  Updated Vital Signs BP 121/81 (BP Location: Left Arm)   Pulse 72   Temp 98 F (36.7 C) (Oral)   Resp 16   SpO2 97%   Visual Acuity Right Eye Distance:   Left Eye Distance:   Bilateral Distance:    Right Eye Near:   Left Eye Near:    Bilateral Near:     Physical Exam Vitals and nursing note reviewed.  Constitutional:      General: He  is not in acute distress.    Appearance: Normal appearance. He is obese. He is not ill-appearing.  Cardiovascular:     Rate and Rhythm: Normal rate and regular rhythm.     Pulses: Normal pulses.     Heart sounds: Normal heart sounds. No murmur heard.    No friction rub. No gallop.  Pulmonary:     Effort: Pulmonary effort is normal.     Breath sounds: Normal breath sounds. No wheezing, rhonchi or rales.  Abdominal:     Palpations: Abdomen is soft.     Tenderness: There is abdominal tenderness. There is no guarding or rebound.  Skin:    General: Skin is warm and dry.     Capillary Refill: Capillary refill takes less than 2 seconds.     Findings: No erythema or rash.  Neurological:     General: No focal deficit present.     Mental Status: He is alert and oriented to person, place, and time.  Psychiatric:        Mood and Affect: Mood normal.        Behavior: Behavior normal.        Thought Content: Thought content normal.        Judgment: Judgment normal.      UC Treatments / Results  Labs (all labs ordered are listed, but only abnormal results are  displayed) Labs Reviewed - No data to display  EKG   Radiology No results found.  Procedures Procedures (including critical care time)  Medications Ordered in UC Medications - No data to display  Initial Impression / Assessment and Plan / UC Course  I have reviewed the triage vital signs and the nursing notes.  Pertinent labs & imaging results that were available during my care of the patient were reviewed by me and considered in my medical decision making (see chart for details).   Patient is a nontoxic-appearing 58 old male here for evaluation of a knot to the right side of his abdomen that has been present for at least a week, not changing in size, and has intermittent dull pain associated with it.  On exam patient's abdomen is protuberant but it is soft.  There is no appreciable muscle defect or hernia either when the patient is lying in a black state or when he activates his abdominal muscles.  There is a small area of muscle tension in the lateral right lower quadrant that I suspect is probably an abdominal muscle strain.  I will treat him for an abdominal muscle strain conservatively with ibuprofen, baclofen, moist heat, and some support.  I did advise the patient that he could purchase an abdominal binder and wear it intermittently to provide support.  He should avoid any heavy lifting or straining until his pain resolves.  If he has any increasing pain, the size of the area increases, he develops a fever, or if he develops nausea or vomiting he should return for reevaluation.  Work note provided.   Final Clinical Impressions(s) / UC Diagnoses   Final diagnoses:  Abdominal muscle strain, initial encounter     Discharge Instructions      Take the ibuprofen, 600 mg every 6 hours with food, on a schedule for the next 48 hours and then as needed.  Take the baclofen, 10 mg every 8 hours, on a schedule for the next 48 hours and then as needed.  Apply moist heat to your abdomen  for 30 minutes at a time 2-3 times  a day to improve blood flow to the area and help remove the lactic acid causing the spasm.  Protect that area from further injury and avoid heavy lifting, straining, or sudden sharp movements.  You can purchase an abdominal binder from [employer] to help support the area until your pain improves.  Return for reevaluation for any new or worsening symptoms.      ED Prescriptions     Medication Sig Dispense Auth. Provider   ibuprofen (ADVIL) 600 MG tablet Take 1 tablet (600 mg total) by mouth every 6 (six) hours as needed. 30 tablet Margarette Canada, NP   baclofen (LIORESAL) 10 MG tablet Take 1 tablet (10 mg total) by mouth 3 (three) times daily. 51 each Margarette Canada, NP      PDMP not reviewed this encounter.   Margarette Canada, NP 08/14/22 [phone]

## 2024-01-08 IMAGING — CR DG FINGER THUMB 2+V*R*
3 series · 3 of 3 positions shown · non-contrast
Comparison: None Available.

CLINICAL DATA: Laceration thumb

EXAM:
RIGHT THUMB 2+V

[finger ap]
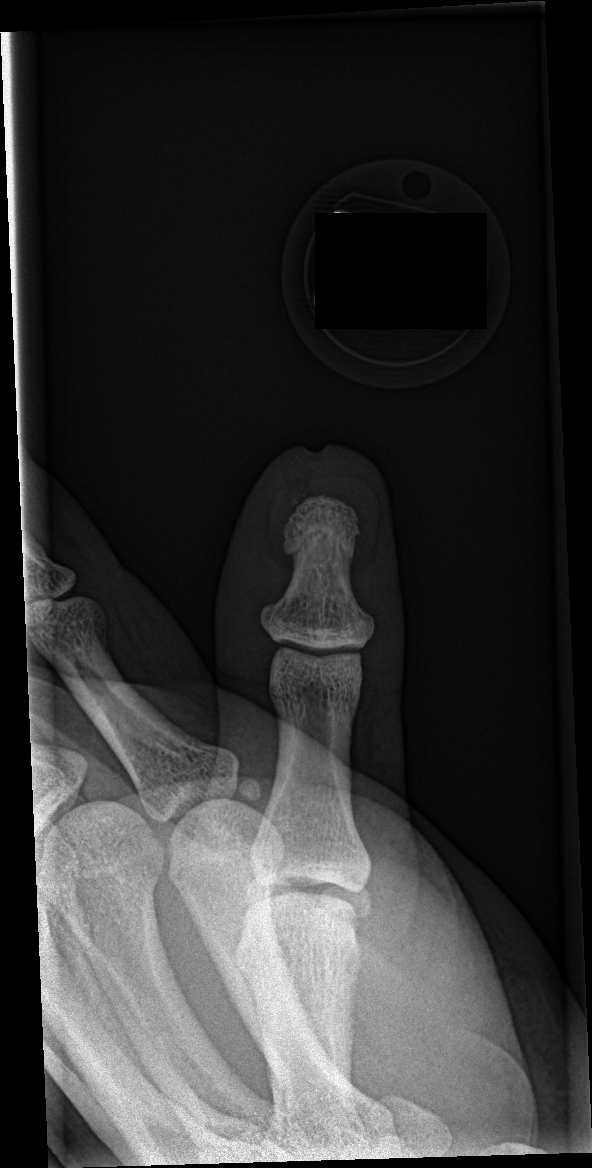

[finger obl]
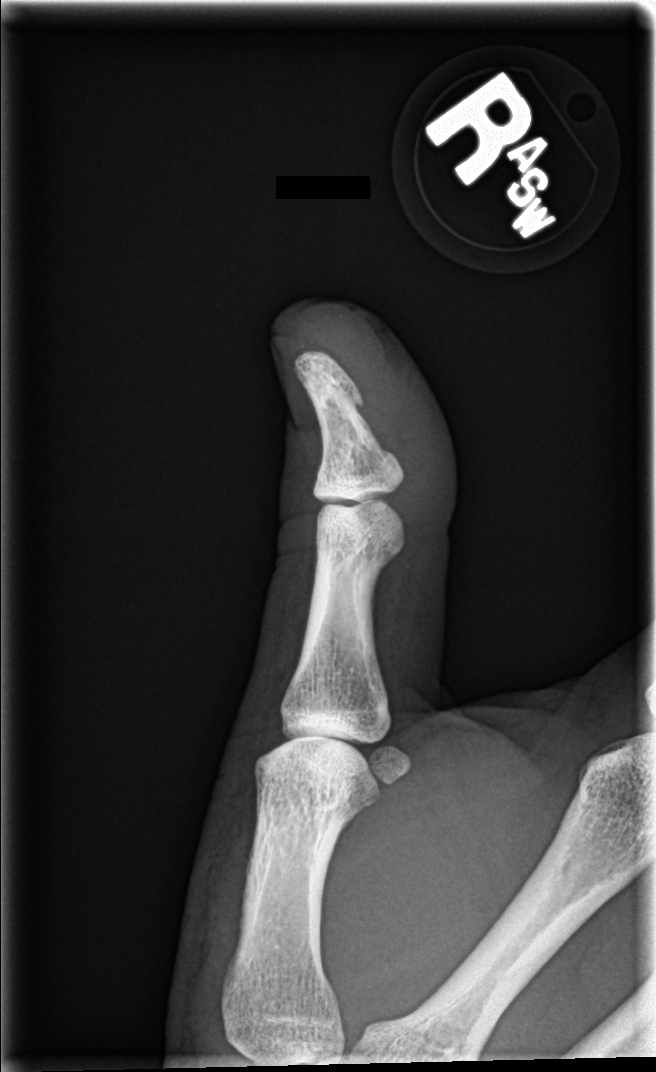

[finger lat]
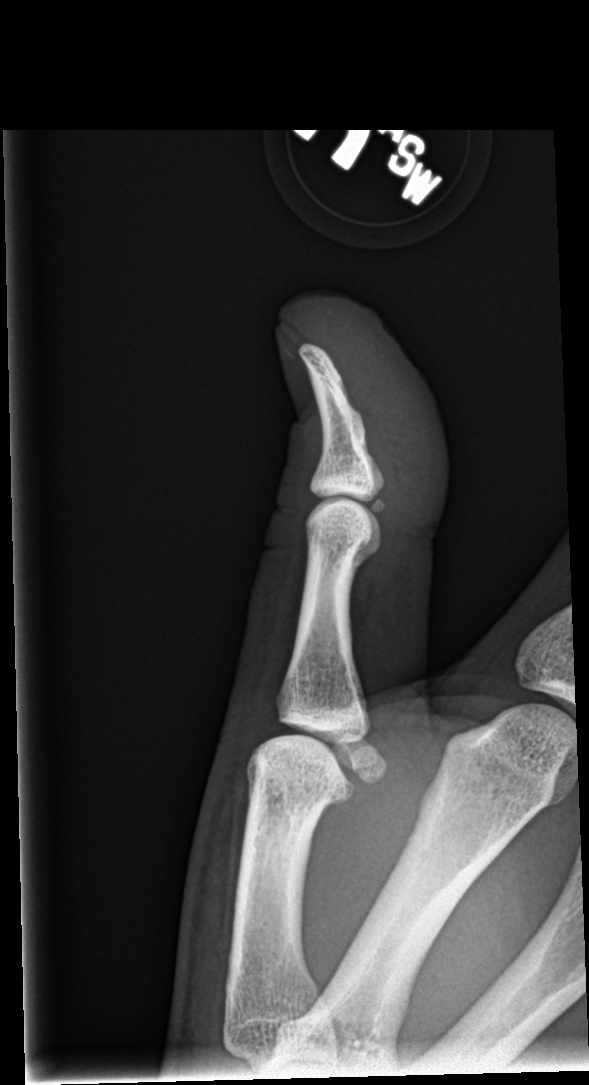

[3 of 3 positions shown; findings below may reference images not displayed]

FINDINGS: Negative for fracture or foreign body. Soft tissue swelling and
laceration distal thumb.
IMPRESSION: Negative for fracture.  Soft tissue injury
